# Patient Record
Sex: Male | Born: 1960 | Race: Black or African American | Hispanic: No | Marital: Married
Health system: Southern US, Community
[De-identification: ages and names within clinical notes are randomized; demographics above are authoritative.]

## PROBLEM LIST (undated history)

## (undated) DIAGNOSIS — J302 Other seasonal allergic rhinitis: Secondary | ICD-10-CM

---

## 2020-07-10 ENCOUNTER — Other Ambulatory Visit: Payer: Self-pay

## 2020-07-10 ENCOUNTER — Encounter (HOSPITAL_BASED_OUTPATIENT_CLINIC_OR_DEPARTMENT_OTHER): Payer: Self-pay | Admitting: Emergency Medicine

## 2020-07-10 DIAGNOSIS — J0101 Acute recurrent maxillary sinusitis: Secondary | ICD-10-CM | POA: Insufficient documentation

## 2020-07-10 DIAGNOSIS — R0981 Nasal congestion: Secondary | ICD-10-CM | POA: Diagnosis present

## 2020-07-10 NOTE — ED Triage Notes (Signed)
Sinus congestion for 1 month, tx'd by MD but still sinus congestion. No fever, sob or cough.

## 2020-07-11 ENCOUNTER — Emergency Department (HOSPITAL_BASED_OUTPATIENT_CLINIC_OR_DEPARTMENT_OTHER): Payer: BC Managed Care – PPO

## 2020-07-11 ENCOUNTER — Emergency Department (HOSPITAL_BASED_OUTPATIENT_CLINIC_OR_DEPARTMENT_OTHER)
Admission: EM | Admit: 2020-07-11 | Discharge: 2020-07-11 | Disposition: A | Discharge status: Home / Self Care | Payer: BC Managed Care – PPO | Attending: Emergency Medicine | Admitting: Emergency Medicine

## 2020-07-11 DIAGNOSIS — J0101 Acute recurrent maxillary sinusitis: Secondary | ICD-10-CM

## 2020-07-11 HISTORY — DX: Other seasonal allergic rhinitis: J30.2

## 2020-07-11 MED ORDER — METHYLPREDNISOLONE 4 MG PO TBPK: ORAL_TABLET | ORAL | 0 refills | Status: AC

## 2020-07-11 MED ORDER — LEVOFLOXACIN 750 MG PO TABS: 750.0000 mg | ORAL_TABLET | Freq: Every day | ORAL | 0 refills | Status: AC

## 2020-07-11 NOTE — ED Provider Notes (Signed)
[facility] Provider Note: Isaiah Dell, MD, FACEP  CSN: [id number] MRN: [id number] ARRIVAL: 07/10/20 at 2132 ROOM: [facility]   CHIEF COMPLAINT  Nasal Congestion   HISTORY OF PRESENT ILLNESS  07/11/20 2:28 AM Isaiah Krause is a 59 y.o. male with about a month of nasal/sinus congestion.  He states he has not been able to breathe out of his nose.  He was seen by his PCP and placed on an unspecified antibiotic without relief.  He was then placed on a prednisone taper which gave him temporary relief but symptoms returned.  He has used oxymetazoline nasal spray without relief.  He denies any associated pain, fever, shortness of breath or cough.   Past Medical History:  Diagnosis Date  . Seasonal allergies     History reviewed. No pertinent surgical history.  No family history on file.  Social History   Tobacco Use  . Smoking status: Not on file  Substance Use Topics  . Alcohol use: Not on file  . Drug use: Not on file    Prior to Admission medications   Medication Sig Start Date End Date Taking? Authorizing Provider  levofloxacin (LEVAQUIN) 750 MG tablet Take 1 tablet (750 mg total) by mouth daily. X 7 days 07/11/20   Krissi Willaims, Kaysin, MD  methylPREDNISolone (MEDROL DOSEPAK) 4 MG TBPK tablet Take tapering course per package instructions. 07/11/20   Markeisha Mancias, Jonny Ruiz, MD    Allergies Patient has no known allergies.   REVIEW OF SYSTEMS  Negative except as noted here or in the History of Present Illness.   PHYSICAL EXAMINATION  Initial Vital Signs Blood pressure (!) 139/91, pulse 74, temperature 98.4 F (36.9 C), temperature source Oral, resp. rate 20, height 6' (1.829 m), weight 101.6 kg, SpO2 100 %.  Examination General: Well-developed, well-nourished male in no acute distress; appearance consistent with age of record HENT: normocephalic; atraumatic; nasal congestion; enlarged nasal turbinates Eyes: pupils equal, round and reactive to light; extraocular muscles  intact Neck: supple Heart: regular rate and rhythm Lungs: clear to auscultation bilaterally Abdomen: soft; nondistended; nontender; bowel sounds present Extremities: No deformity; full range of motion Neurologic: Awake, alert and oriented; motor function intact in all extremities and symmetric; no facial droop Skin: Warm and dry Psychiatric: Normal mood and affect   RESULTS  Summary of this visit's results, reviewed and interpreted by myself:   EKG Interpretation  Date/Time:    Ventricular Rate:    PR Interval:    QRS Duration:   QT Interval:    QTC Calculation:   R Axis:     Text Interpretation:        Laboratory Studies: No results found for this or any previous visit (from the past 24 hour(s)). Imaging Studies: CT Maxillofacial Wo Contrast  Result Date: 07/11/2020 CLINICAL DATA:  Sinus congestion for 1 month EXAM: CT MAXILLOFACIAL WITHOUT CONTRAST TECHNIQUE: Multidetector CT imaging of the maxillofacial structures was performed. Multiplanar CT image reconstructions were also generated. COMPARISON:  None. FINDINGS: Osseous: No acute fracture or focal bone lesion identified. Multiple dental reconstructions. Orbits: Globes and extraocular muscles appear intact and symmetrical. Sinuses: Air-fluid levels are demonstrated in both maxillary antra suggesting acute sinusitis. Ethmoid air cells, frontal sinuses, and sphenoid sinuses are clear. Mastoid air cells are clear. Engorgement of the nasal turbinates. No definite mass or polyp. Soft tissues: No periorbital or facial soft tissue swelling or hematoma. Limited intracranial: Visualized intracranial structures are unremarkable. IMPRESSION: Air-fluid levels in both maxillary antra suggesting acute sinusitis. Electronically Signed  By: Burman Nieves M.D.   On: 07/11/2020 03:04    ED COURSE and MDM  Nursing notes, initial and subsequent vitals signs, including pulse oximetry, reviewed and interpreted by myself.  Vitals:   07/10/20  2145 07/10/20 2146 07/11/20 0210  BP: (!) 142/85  (!) 139/91  Pulse: 85  74  Resp: 20  20  Temp: 98.4 F (36.9 C)    TempSrc: Oral    SpO2: 99%  100%  Weight:  101.6 kg   Height:  6' (1.829 m)    Medications - No data to display  CT consistent with bilateral acute maxillary sinusitis.  This is recurrent.  We will place him on levofloxacin for recurrent sinusitis.  We will also place him on another course of steroids.  Will refer to ENT should symptoms persist as he may need specialist opinion.  PROCEDURES  Procedures   ED DIAGNOSES     ICD-10-CM   1. Acute recurrent maxillary sinusitis  J01.01        Latavious Bitter, Jonny Ruiz, MD 07/11/20 [phone]

## 2022-05-17 IMAGING — CT CT MAXILLOFACIAL W/O CM
3 series · 16 of 47 positions shown, 19 images · non-contrast
Comparison: None.

CLINICAL DATA: Sinus congestion for 1 month

EXAM:
CT MAXILLOFACIAL WITHOUT CONTRAST
TECHNIQUE: Multidetector CT imaging of the maxillofacial structures was
performed. Multiplanar CT image reconstructions were also generated.

[Series 2: max soft · axial · 0.38mm/px · z∈[+1130,+1280]mm · 10 of 88 slices shown, 13 images]
[im 7/88  brain]
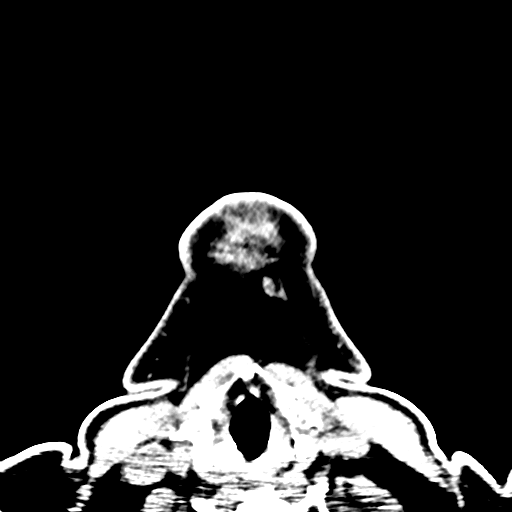
[im 7/88  bone]
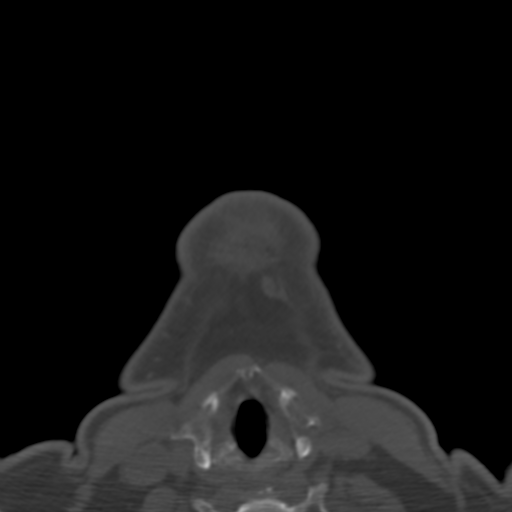
[im 16/88  bone]
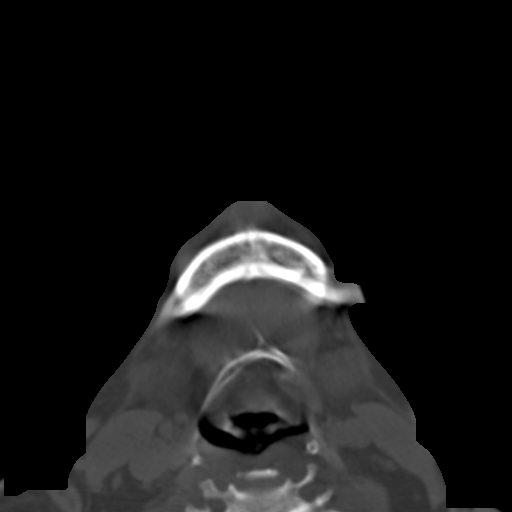
[im 25/88  bone]
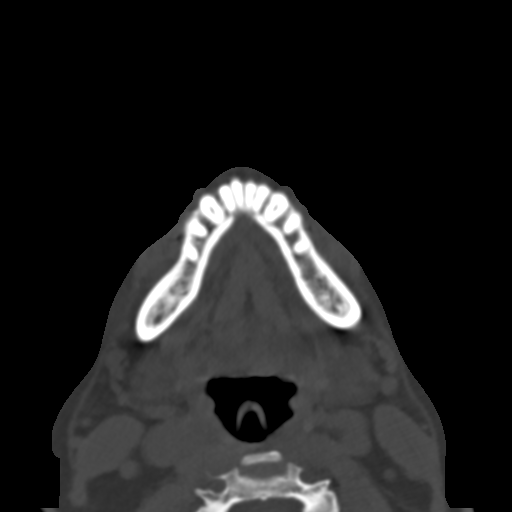
[im 31/88  bone]
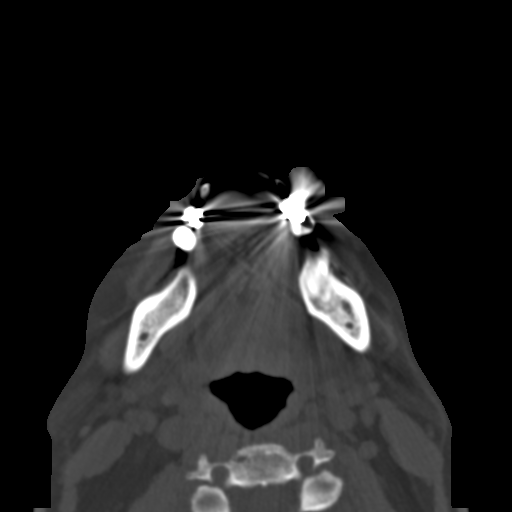
[im 40/88  brain]
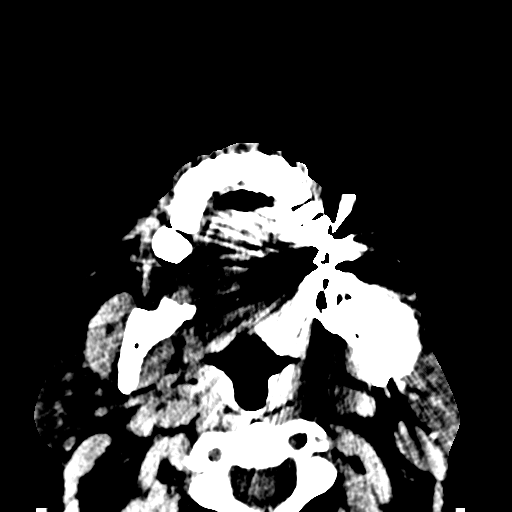
[im 40/88  bone]
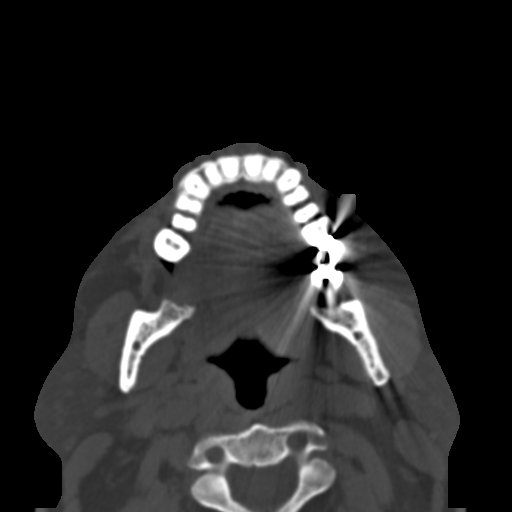
[im 49/88  bone]
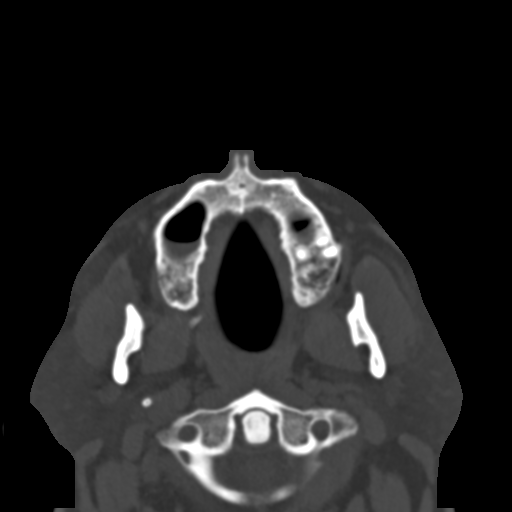
[im 58/88  bone]
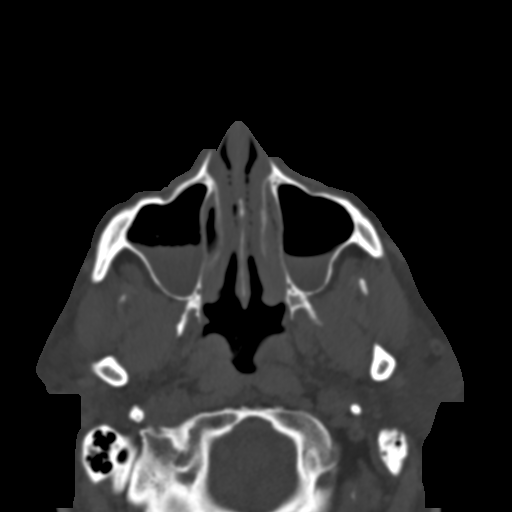
[im 67/88  bone]
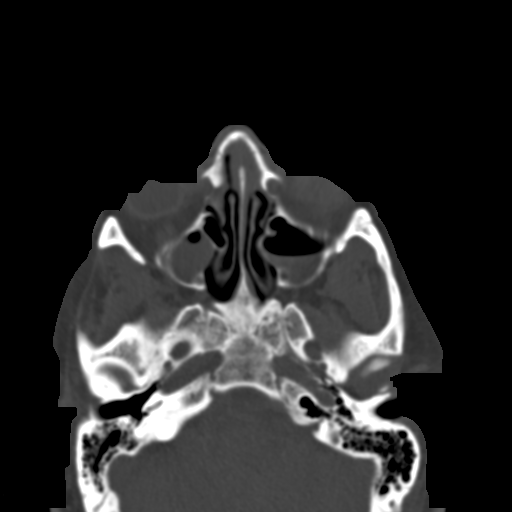
[im 73/88  brain]
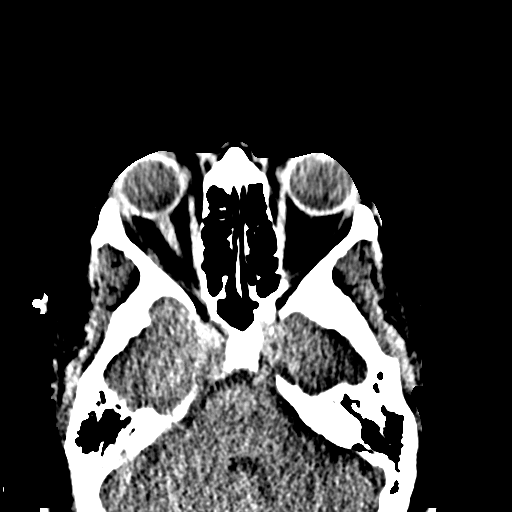
[im 73/88  bone]
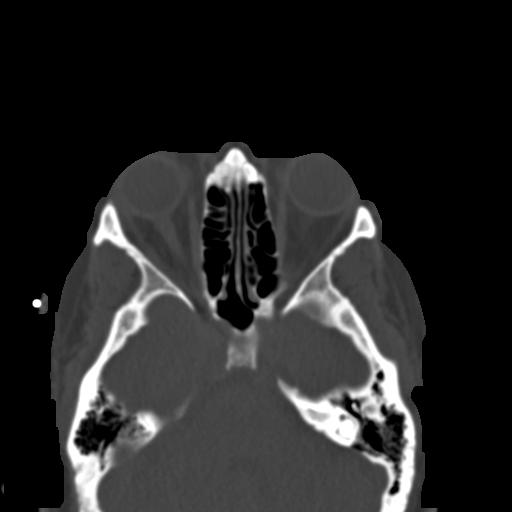
[im 82/88  bone]
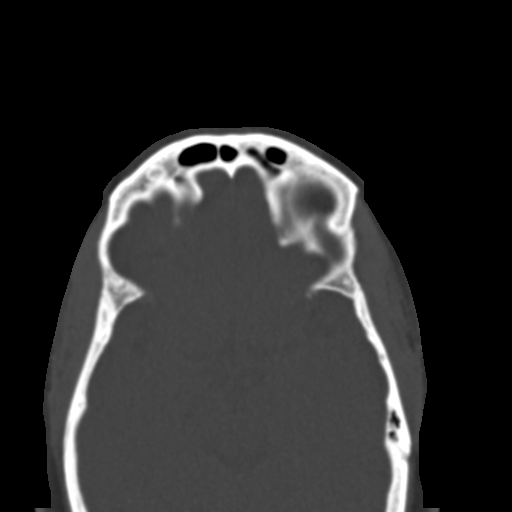

[Series 6: coronal soft · coronal · 0.39mm/px · 3 of 69 slices shown]
[im 23/69  bone]
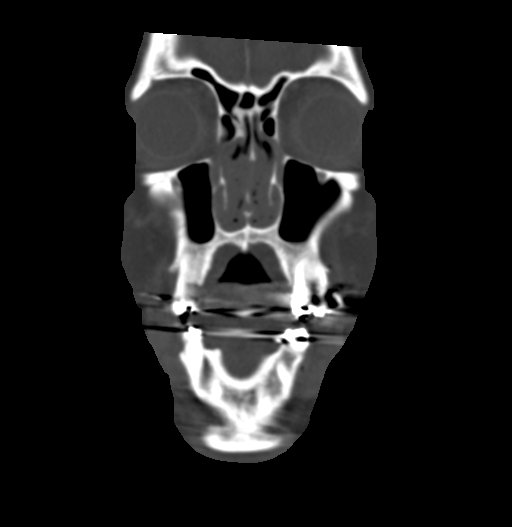
[im 31/69  bone]
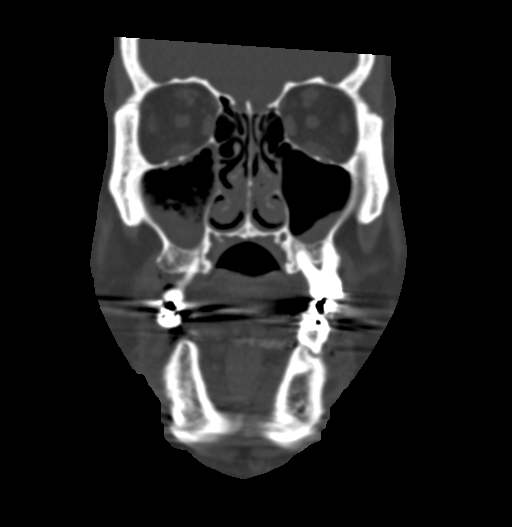
[im 38/69  bone]
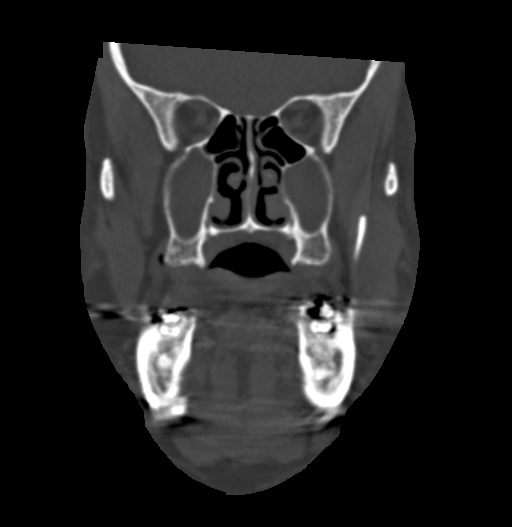

[Series 7: sagittal soft · sagittal · 0.31mm/px · 3 of 85 slices shown]
[im 29/85  bone]
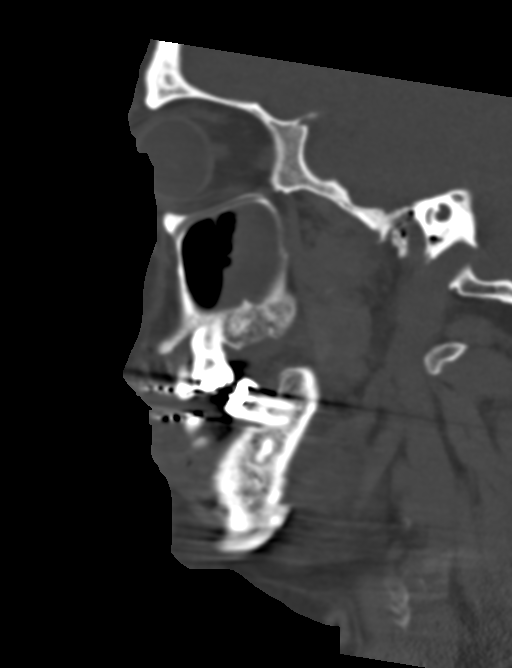
[im 43/85  bone]
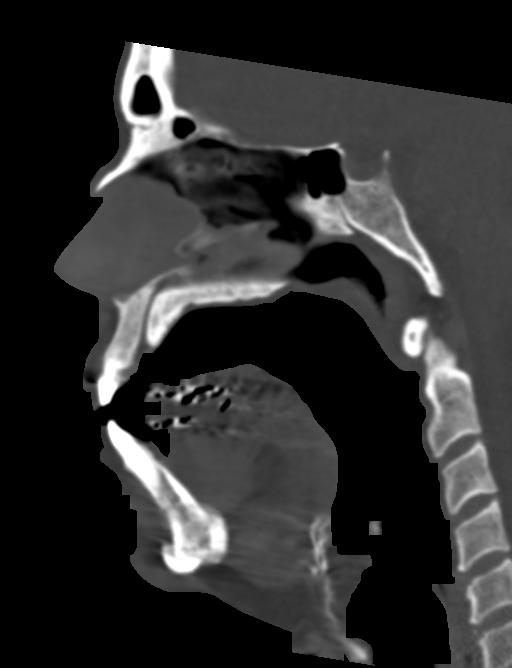
[im 57/85  bone]
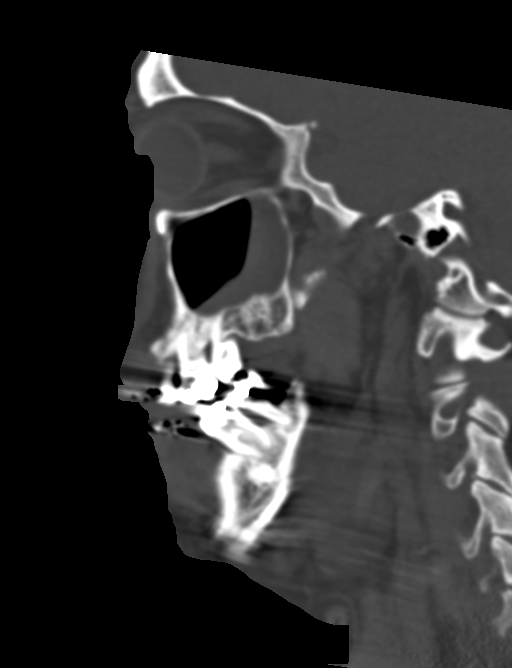

[16 of 47 positions shown; findings below may reference images not displayed]

FINDINGS: Osseous: No acute fracture or focal bone lesion identified. Multiple
dental reconstructions.

Orbits: Globes and extraocular muscles appear intact and
symmetrical.

Sinuses: Air-fluid levels are demonstrated in both maxillary antra
suggesting acute sinusitis. Ethmoid air cells, frontal sinuses, and
sphenoid sinuses are clear. Mastoid air cells are clear. Engorgement
of the nasal turbinates. No definite mass or polyp.

Soft tissues: No periorbital or facial soft tissue swelling or
hematoma.

Limited intracranial: Visualized intracranial structures are
unremarkable.
IMPRESSION: Air-fluid levels in both maxillary antra suggesting acute sinusitis.
